# Patient Record
Sex: Female | Born: 2000 | Race: Black or African American | Hispanic: No | Marital: Single | Smoking: Never smoker
Health system: Southern US, Community
[De-identification: ages and names within clinical notes are randomized; demographics above are authoritative.]

## PROBLEM LIST (undated history)

## (undated) ENCOUNTER — Emergency Department (HOSPITAL_COMMUNITY)

---
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
## 2023-03-20 ENCOUNTER — Encounter (HOSPITAL_COMMUNITY): Payer: Self-pay

## 2023-03-20 ENCOUNTER — Emergency Department (HOSPITAL_COMMUNITY): Payer: No Typology Code available for payment source

## 2023-03-20 ENCOUNTER — Emergency Department (HOSPITAL_COMMUNITY)
Admission: EM | Admit: 2023-03-20 | Discharge: 2023-03-20 | Disposition: A | Discharge status: Home / Self Care | Payer: No Typology Code available for payment source | Attending: Emergency Medicine | Admitting: Emergency Medicine

## 2023-03-20 ENCOUNTER — Other Ambulatory Visit: Payer: Self-pay

## 2023-03-20 DIAGNOSIS — J101 Influenza due to other identified influenza virus with other respiratory manifestations: Secondary | ICD-10-CM | POA: Diagnosis not present

## 2023-03-20 DIAGNOSIS — D509 Iron deficiency anemia, unspecified: Secondary | ICD-10-CM | POA: Diagnosis not present

## 2023-03-20 DIAGNOSIS — N920 Excessive and frequent menstruation with regular cycle: Secondary | ICD-10-CM | POA: Insufficient documentation

## 2023-03-20 DIAGNOSIS — R509 Fever, unspecified: Secondary | ICD-10-CM | POA: Diagnosis present

## 2023-03-20 DIAGNOSIS — E860 Dehydration: Secondary | ICD-10-CM | POA: Diagnosis not present

## 2023-03-20 DIAGNOSIS — Z20822 Contact with and (suspected) exposure to covid-19: Secondary | ICD-10-CM | POA: Insufficient documentation

## 2023-03-20 LAB — URINALYSIS, ROUTINE W REFLEX MICROSCOPIC
Bilirubin Urine: NEGATIVE
Glucose, UA: NEGATIVE mg/dL
Hgb urine dipstick: NEGATIVE
Ketones, ur: NEGATIVE mg/dL
Nitrite: NEGATIVE
Protein, ur: 30 mg/dL — AB
Specific Gravity, Urine: 1.027 (ref 1.005–1.030)
pH: 5 (ref 5.0–8.0)

## 2023-03-20 LAB — RETICULOCYTES
Immature Retic Fract: 16.3 % — ABNORMAL HIGH (ref 2.3–15.9)
RBC.: 4.94 MIL/uL (ref 3.87–5.11)
Retic Count, Absolute: 39 10*3/uL (ref 19.0–186.0)
Retic Ct Pct: 0.8 % (ref 0.4–3.1)

## 2023-03-20 LAB — COMPREHENSIVE METABOLIC PANEL
ALT: 30 U/L (ref 0–44)
AST: 66 U/L — ABNORMAL HIGH (ref 15–41)
Albumin: 3.3 g/dL — ABNORMAL LOW (ref 3.5–5.0)
Alkaline Phosphatase: 54 U/L (ref 38–126)
Anion gap: 11 (ref 5–15)
BUN: 8 mg/dL (ref 6–20)
CO2: 22 mmol/L (ref 22–32)
Calcium: 9.5 mg/dL (ref 8.9–10.3)
Chloride: 103 mmol/L (ref 98–111)
Creatinine, Ser: 1.06 mg/dL — ABNORMAL HIGH (ref 0.44–1.00)
GFR, Estimated: >60 mL/min (ref >60)
Glucose, Bld: 141 mg/dL — ABNORMAL HIGH (ref 70–99)
Potassium: 4.7 mmol/L (ref 3.5–5.1)
Sodium: 136 mmol/L (ref 135–145)
Total Bilirubin: 0.6 mg/dL (ref 0.0–1.2)
Total Protein: 7.9 g/dL (ref 6.5–8.1)

## 2023-03-20 LAB — HCG, SERUM, QUALITATIVE: Preg, Serum: NEGATIVE

## 2023-03-20 LAB — IRON AND TIBC
Iron: 10 ug/dL — ABNORMAL LOW (ref 28–170)
Saturation Ratios: 2 % — ABNORMAL LOW (ref 10.4–31.8)
TIBC: 441 ug/dL (ref 250–450)
UIBC: 431 ug/dL

## 2023-03-20 LAB — CBC WITH DIFFERENTIAL/PLATELET
Abs Immature Granulocytes: 0.07 10*3/uL (ref 0.00–0.07)
Basophils Absolute: 0 10*3/uL (ref 0.0–0.1)
Basophils Relative: 0 %
Eosinophils Absolute: 0 10*3/uL (ref 0.0–0.5)
Eosinophils Relative: 0 %
HCT: 34.6 % — ABNORMAL LOW (ref 36.0–46.0)
Hemoglobin: 9.8 g/dL — ABNORMAL LOW (ref 12.0–15.0)
Immature Granulocytes: 0 %
Lymphocytes Relative: 7 %
Lymphs Abs: 1.1 10*3/uL (ref 0.7–4.0)
MCH: 18.4 pg — ABNORMAL LOW (ref 26.0–34.0)
MCHC: 28.3 g/dL — ABNORMAL LOW (ref 30.0–36.0)
MCV: 65 fL — ABNORMAL LOW (ref 80.0–100.0)
Monocytes Absolute: 1.8 10*3/uL — ABNORMAL HIGH (ref 0.1–1.0)
Monocytes Relative: 11 %
Neutro Abs: 12.7 10*3/uL — ABNORMAL HIGH (ref 1.7–7.7)
Neutrophils Relative %: 82 %
Platelets: 343 10*3/uL (ref 150–400)
RBC: 5.32 MIL/uL — ABNORMAL HIGH (ref 3.87–5.11)
RDW: 19.3 % — ABNORMAL HIGH (ref 11.5–15.5)
WBC: 15.7 10*3/uL — ABNORMAL HIGH (ref 4.0–10.5)
nRBC: 0 % (ref 0.0–0.2)

## 2023-03-20 LAB — RESP PANEL BY RT-PCR (RSV, FLU A&B, COVID)  RVPGX2
Influenza A by PCR: POSITIVE — AB
Influenza B by PCR: NEGATIVE
Resp Syncytial Virus by PCR: NEGATIVE
SARS Coronavirus 2 by RT PCR: NEGATIVE

## 2023-03-20 LAB — CBG MONITORING, ED: Glucose-Capillary: 149 mg/dL — ABNORMAL HIGH (ref 70–99)

## 2023-03-20 LAB — FERRITIN: Ferritin: 14 ng/mL (ref 11–307)

## 2023-03-20 LAB — D-DIMER, QUANTITATIVE: D-Dimer, Quant: 1.72 ug{FEU}/mL — ABNORMAL HIGH (ref 0.00–0.50)

## 2023-03-20 LAB — TROPONIN I (HIGH SENSITIVITY)
Troponin I (High Sensitivity): 3 ng/L (ref <18)
Troponin I (High Sensitivity): 3 ng/L (ref <18)

## 2023-03-20 LAB — VITAMIN B12: Vitamin B-12: 404 pg/mL (ref 180–914)

## 2023-03-20 LAB — FOLATE: Folate: 9.7 ng/mL (ref >5.9)

## 2023-03-20 MED ORDER — FERROUS SULFATE 325 (65 FE) MG PO TABS: 325.0000 mg | ORAL_TABLET | Freq: Every day | ORAL | 0 refills | Status: AC

## 2023-03-20 MED ORDER — ONDANSETRON 4 MG PO TBDP: 4.0000 mg | ORAL_TABLET | Freq: Three times a day (TID) | ORAL | 0 refills | Status: AC | PRN

## 2023-03-20 MED ORDER — SODIUM CHLORIDE 0.9 % IV BOLUS
1000.0000 mL | Freq: Once | INTRAVENOUS | Status: AC
  Administered 2023-03-20: 1000 mL via INTRAVENOUS

## 2023-03-20 MED ORDER — IOHEXOL 350 MG/ML SOLN
75.0000 mL | Freq: Once | INTRAVENOUS | Status: AC | PRN
  Administered 2023-03-20: 75 mL via INTRAVENOUS

## 2023-03-20 MED ORDER — OSELTAMIVIR PHOSPHATE 75 MG PO CAPS: 75.0000 mg | ORAL_CAPSULE | Freq: Two times a day (BID) | ORAL | 0 refills | Status: AC

## 2023-03-20 NOTE — ED Provider Notes (Signed)
 [place] EMERGENCY DEPARTMENT AT [facility] Provider Note   CSN: [id number] Arrival date & time: 03/20/23  0801     History  Chief Complaint  Patient presents with   Loss of Consciousness    Ashley Cain is a 23 y.o. female.  Pt is a 23 yo female with no significant pmhx.  Pt said she was on the ground and got up to get some water.  She then passed out.  Pt denies any injury from this episode.  Pt said she has had uri sx for the past few days.  No known fever.  EMS was called and she was slightly bradycardic and bp 80/40, so she was given 500 cc NS bolus.  Pt is feeling better now.  She denies any pain.         Home Medications Prior to Admission medications   Medication Sig Start Date End Date Taking? Authorizing Provider  ferrous sulfate  325 (65 FE) MG tablet Take 1 tablet (325 mg total) by mouth daily. 03/20/23  Yes Dean Clarity, MD  ondansetron  (ZOFRAN -ODT) 4 MG disintegrating tablet Take 1 tablet (4 mg total) by mouth every 8 (eight) hours as needed. 03/20/23  Yes Dean Clarity, MD  oseltamivir  (TAMIFLU ) 75 MG capsule Take 1 capsule (75 mg total) by mouth every 12 (twelve) hours. 03/20/23  Yes Dean Clarity, MD  Phenyleph-CPM-DM-APAP (DIMETAPP MULTISYMPTOM COLD/FLU PO) Take 5 mLs by mouth 2 (two) times daily as needed (For flu symptoms).    [provider]      Allergies    Patient has no known allergies.    Review of Systems   Review of Systems  HENT:  Positive for congestion.   Neurological:  Positive for syncope.  All other systems reviewed and are negative.   Physical Exam Updated Vital Signs BP (!) 107/59   Pulse 82   Temp 98.1 F (36.7 C) (Oral)   Resp (!) 27   Ht 5' 6 (1.676 m)   Wt 104.3 kg   SpO2 100%   BMI 37.12 kg/m  Physical Exam Vitals and nursing note reviewed.  Constitutional:      Appearance: Normal appearance. She is obese.  HENT:     Head: Normocephalic and atraumatic.     Right Ear: External ear normal.      Left Ear: External ear normal.     Nose: Congestion present.     Mouth/Throat:     Mouth: Mucous membranes are moist.     Pharynx: Oropharynx is clear.  Eyes:     Extraocular Movements: Extraocular movements intact.     Conjunctiva/sclera: Conjunctivae normal.     Pupils: Pupils are equal, round, and reactive to light.  Cardiovascular:     Rate and Rhythm: Normal rate and regular rhythm.     Pulses: Normal pulses.     Heart sounds: Normal heart sounds.  Pulmonary:     Effort: Pulmonary effort is normal.     Breath sounds: Normal breath sounds.  Abdominal:     General: Abdomen is flat. Bowel sounds are normal.     Palpations: Abdomen is soft.  Musculoskeletal:        General: Normal range of motion.     Cervical back: Normal range of motion and neck supple.  Skin:    General: Skin is warm.     Capillary Refill: Capillary refill takes less than 2 seconds.  Neurological:     General: No focal deficit present.  Mental Status: She is alert and oriented to person, place, and time.  Psychiatric:        Mood and Affect: Mood normal.        Behavior: Behavior normal.     ED Results / Procedures / Treatments   Labs (all labs ordered are listed, but only abnormal results are displayed) Labs Reviewed  RESP PANEL BY RT-PCR (RSV, FLU A&B, COVID)  RVPGX2 - Abnormal; Notable for the following components:      Result Value   Influenza A by PCR POSITIVE (*)    All other components within normal limits  CBC WITH DIFFERENTIAL/PLATELET - Abnormal; Notable for the following components:   WBC 15.7 (*)    RBC 5.32 (*)    Hemoglobin 9.8 (*)    HCT 34.6 (*)    MCV 65.0 (*)    MCH 18.4 (*)    MCHC 28.3 (*)    RDW 19.3 (*)    Neutro Abs 12.7 (*)    Monocytes Absolute 1.8 (*)    All other components within normal limits  COMPREHENSIVE METABOLIC PANEL - Abnormal; Notable for the following components:   Glucose, Bld 141 (*)    Creatinine, Ser 1.06 (*)    Albumin 3.3 (*)    AST 66 (*)     All other components within normal limits  URINALYSIS, ROUTINE W REFLEX MICROSCOPIC - Abnormal; Notable for the following components:   APPearance HAZY (*)    Protein, ur 30 (*)    Leukocytes,Ua LARGE (*)    Bacteria, UA RARE (*)    All other components within normal limits  D-DIMER, QUANTITATIVE - Abnormal; Notable for the following components:   D-Dimer, Quant 1.72 (*)    All other components within normal limits  RETICULOCYTES - Abnormal; Notable for the following components:   Immature Retic Fract 16.3 (*)    All other components within normal limits  CBG MONITORING, ED - Abnormal; Notable for the following components:   Glucose-Capillary 149 (*)    All other components within normal limits  HCG, SERUM, QUALITATIVE  VITAMIN B12  FOLATE  IRON AND TIBC  FERRITIN  TROPONIN I (HIGH SENSITIVITY)  TROPONIN I (HIGH SENSITIVITY)    EKG EKG Interpretation Date/Time:  Sunday March 20 2023 08:29:41 EST Ventricular Rate:  71 PR Interval:  159 QRS Duration:  89 QT Interval:  339 QTC Calculation: 369 R Axis:   67  Text Interpretation: Sinus rhythm Nonspecific repol abnormality, inferior leads ST elevation, consider lateral injury Baseline wander in lead(s) V1 No old tracing to compare Confirmed by Dean Clarity [id number]) on 03/20/2023 8:34:01 AM  Radiology CT Angio Chest PE W and/or Wo Contrast Result Date: 03/20/2023 CLINICAL DATA:  Syncope, chest pain EXAM: CT ANGIOGRAPHY CHEST WITH CONTRAST TECHNIQUE: Multidetector CT imaging of the chest was performed using the standard protocol during bolus administration of intravenous contrast. Multiplanar CT image reconstructions and MIPs were obtained to evaluate the vascular anatomy. RADIATION DOSE REDUCTION: This exam was performed according to the departmental dose-optimization program which includes automated exposure control, adjustment of the mA and/or kV according to patient size and/or use of iterative reconstruction technique.  CONTRAST:  75mL OMNIPAQUE  IOHEXOL  350 MG/ML SOLN COMPARISON:  None Available. FINDINGS: Cardiovascular: Satisfactory opacification of the pulmonary arteries to the segmental level. No evidence of pulmonary embolism. Normal heart size. No pericardial effusion. Mediastinum/Nodes: No enlarged mediastinal, hilar, or axillary lymph nodes. Thyroid gland, trachea, and esophagus demonstrate no significant findings. Lungs/Pleura: Lungs are clear. No  pleural effusion or pneumothorax. Upper Abdomen: No acute abnormality. Musculoskeletal: No chest wall abnormality. No acute or significant osseous findings. Review of the MIP images confirms the above findings. IMPRESSION: Negative for acute pulmonary embolus, pneumonia or other acute cardiopulmonary process. Electronically Signed   By: Wilkie Lent M.D.   On: 03/20/2023 10:50   DG Chest Port 1 View Result Date: 03/20/2023 CLINICAL DATA:  Syncopal episode.  Bradycardia. EXAM: PORTABLE CHEST 1 VIEW COMPARISON:  None Available. FINDINGS: The heart size and mediastinal contours are within normal limits. Both lungs are clear. The visualized skeletal structures are unremarkable. IMPRESSION: No active disease. Electronically Signed   By: Norleen DELENA Kil M.D.   On: 03/20/2023 09:20    Procedures Procedures    Medications Ordered in ED Medications  sodium chloride  0.9 % bolus 1,000 mL (0 mLs Intravenous Stopped 03/20/23 1047)  iohexol  (OMNIPAQUE ) 350 MG/ML injection 75 mL (75 mLs Intravenous Contrast Given 03/20/23 1040)    ED Course/ Medical Decision Making/ A&P                                 Medical Decision Making Amount and/or Complexity of Data Reviewed Labs: ordered. Radiology: ordered.  Risk OTC drugs. Prescription drug management.   This patient presents to the ED for concern of syncope, this involves an extensive number of treatment options, and is a complaint that carries with it a high risk of complications and morbidity.  The differential diagnosis  includes orthostatic, cardiogenic, vasovagal, infection, pregnancy, PE   Co morbidities that complicate the patient evaluation  none   Additional history obtained:  Additional history obtained from epic chart review External records from outside source obtained and reviewed including EMS report   Lab Tests:  I Ordered, and personally interpreted labs.  The pertinent results include:  cbc with hgb 9.8 (no old labs); cmp nl; flu a +; covid/rsv neg; preg neg, trop neg; ua with lg le, but rare bacteria (no urinary sx)   Imaging Studies ordered:  I ordered imaging studies including cxr, ct chest  I independently visualized and interpreted imaging which showed  CXR: No active disease.  CT chest: Negative for acute pulmonary embolus, pneumonia or other acute  cardiopulmonary process.   I agree with the radiologist interpretation   Cardiac Monitoring:  The patient was maintained on a cardiac monitor.  I personally viewed and interpreted the cardiac monitored which showed an underlying rhythm of: nsr   Medicines ordered and prescription drug management:  I ordered medication including ivfs  for sx  Reevaluation of the patient after these medicines showed that the patient improved I have reviewed the patients home medicines and have made adjustments as needed   Test Considered:  ct   Critical Interventions:  ivfs   Problem List / ED Course:  Influenza A:  pt is not hypoxic and cxr is clear.  She is d/c with tamiflu  Syncope:  likely due to dehydration from the flu.  She is feeling better after fluids. Anemia:  pt reports heavy periods.  Anemia panel ordered which is pending, but I suspect anemia is due to iron deficiency.  She is started on iron and told to f/u with gyn.   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   Dispostion:  After consideration of  the diagnostic results and the patients response to treatment, I feel that the patent would benefit from discharge with outpatient f/u.          Final Clinical Impression(s) / ED Diagnoses Final diagnoses:  Influenza A  Iron deficiency anemia, unspecified iron deficiency anemia type  Dehydration  Menorrhagia with regular cycle    Rx / DC Orders ED Discharge Orders          Ordered    oseltamivir  (TAMIFLU ) 75 MG capsule  Every 12 hours        03/20/23 1202    ferrous sulfate  325 (65 FE) MG tablet  Daily        03/20/23 1202    ondansetron  (ZOFRAN -ODT) 4 MG disintegrating tablet  Every 8 hours PRN        03/20/23 1202              Dean Clarity, MD 03/20/23 1206

## 2023-03-20 NOTE — ED Notes (Signed)
 Portable Xray at bedside.

## 2023-03-20 NOTE — ED Triage Notes (Signed)
 Pt BIB [facility] from home C/O syncopal episode. Per EMS, pt was playing on floor with family member (child) and had brief LOC. EMS reports pt did have an episode of symptomatic bradycardia with junctional rhythm where she became hypotensive (80/40), then recovered back to baseline. A&O X 4 on arrival to ED.

## 2023-07-14 ENCOUNTER — Ambulatory Visit: Admitting: Podiatry

## 2023-07-21 ENCOUNTER — Ambulatory Visit (INDEPENDENT_AMBULATORY_CARE_PROVIDER_SITE_OTHER)

## 2023-07-21 ENCOUNTER — Ambulatory Visit: Admitting: Podiatry

## 2023-07-21 ENCOUNTER — Encounter: Payer: Self-pay | Admitting: Podiatry

## 2023-07-21 VITALS — Ht 66.0 in | Wt 230.0 lb

## 2023-07-21 DIAGNOSIS — M79672 Pain in left foot: Secondary | ICD-10-CM

## 2023-07-21 DIAGNOSIS — R2240 Localized swelling, mass and lump, unspecified lower limb: Secondary | ICD-10-CM

## 2023-07-21 DIAGNOSIS — M7752 Other enthesopathy of left foot: Secondary | ICD-10-CM

## 2023-07-21 NOTE — Progress Notes (Signed)
       Chief Complaint  Patient presents with   Foot Pain    Pt is here due to left foot has a nodule on the top of it, pt states she has had it there for years, hurts when she wears certain shoes, stepped on it wrong last week and foot was in pain all that day.    HPI: 23 y.o. female presents today with concern for large nodule to the left foot.  The area is firm.  It can become painful with certain shoes.  Does not routinely painful with ambulation.  History reviewed. No pertinent past medical history.  History reviewed. No pertinent surgical history.  No Known Allergies  ROS denies any nausea, vomiting, fever, chills, chest pain, shortness of breath   Physical Exam: There were no vitals filed for this visit.  General: The patient is alert and oriented x3 in no acute distress.  Dermatology: Skin is warm, dry and supple bilateral lower extremities. Interspaces are clear of maceration and debris.    Vascular: Palpable pedal pulses bilaterally. Capillary refill within normal limits.  No appreciable edema.  No erythema or calor.  Neurological: Light touch sensation grossly intact bilateral feet.   Musculoskeletal Exam: Large bone spur present left first tarsometatarsal joint dorsal aspect.  Muscle strength 5/5 for all major muscle groups.  No pain with first ray or first MPJ range of motion.  Radiographic Exam: Left foot 3 views weightbearing 07/21/2023 Large dorsal spur present first tarsometatarsal joint.  Some irregularities appreciated to the joint with some arthritic changes.  Assessment/Plan of Care: 1. Bone spur of left foot      No orders of the defined types were placed in this encounter.  None  Discussed clinical findings with patient today.  Radiographs reviewed with patient  In the interim discussed accommodative shoe gear, shoe gear modifications, padding over the lesion.  We did also discuss potential partial exostectomy of the area.  She would like to  consider this going forward.  She will try accommodative measures first.  May take over-the-counter NSAIDs as needed.  Follow-up in approximately 1 month.   Glorene Leitzke L. Lunda Salines, AACFAS [facility]     [address] [place], [place] [address]                Office [phone]  Fax [phone]

## 2023-07-25 ENCOUNTER — Encounter: Payer: Self-pay | Admitting: Podiatry

## 2023-08-25 ENCOUNTER — Ambulatory Visit (INDEPENDENT_AMBULATORY_CARE_PROVIDER_SITE_OTHER): Admitting: Podiatry

## 2023-08-25 DIAGNOSIS — Z91199 Patient's noncompliance with other medical treatment and regimen due to unspecified reason: Secondary | ICD-10-CM

## 2023-08-27 NOTE — Progress Notes (Signed)
Patient did not show for scheduled appointment today.
